# Patient Record
Sex: Female | Born: 1995 | Race: White | Hispanic: No | Marital: Single
Health system: Northeastern US, Academic
[De-identification: ages and names within clinical notes are randomized; demographics above are authoritative.]

---
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
## 2016-10-17 ENCOUNTER — Encounter: Payer: Self-pay | Admitting: Podiatry

## 2016-10-17 ENCOUNTER — Ambulatory Visit (INDEPENDENT_AMBULATORY_CARE_PROVIDER_SITE_OTHER): Payer: BLUE CROSS/BLUE SHIELD | Admitting: Podiatry

## 2016-10-17 VITALS — BP 117/83 | HR 75 | Temp 97.9°F | Resp 16 | Ht 63.0 in | Wt 105.0 lb

## 2016-10-17 DIAGNOSIS — L600 Ingrowing nail: Secondary | ICD-10-CM | POA: Diagnosis not present

## 2016-10-17 DIAGNOSIS — M79676 Pain in unspecified toe(s): Secondary | ICD-10-CM

## 2016-10-17 DIAGNOSIS — L03039 Cellulitis of unspecified toe: Secondary | ICD-10-CM | POA: Diagnosis not present

## 2016-10-17 MED ORDER — DOXYCYCLINE HYCLATE 100 MG PO TABS: 100.0000 mg | ORAL_TABLET | Freq: Two times a day (BID) | ORAL | 0 refills | Status: AC

## 2016-10-17 NOTE — Patient Instructions (Signed)

## 2016-10-17 NOTE — Progress Notes (Signed)
   Subjective:    Patient ID: Jillian Bell, female    DOB: 01/11/1996, 20 y.o.   MRN: [id number]  HPI    Review of Systems  All other systems reviewed and are negative.      Objective:   Physical Exam        Assessment & Plan:

## 2016-10-17 NOTE — Progress Notes (Signed)
Patient ID: Jillian Bell, female   DOB: 04/30/1996, 20 y.o.   MRN: [id number] Subjective: Patient presents today for evaluation of pain in her toe(s). Patient is concerned for possible ingrown nail. Patient states that the pain has been present for a few weeks now. Patient presents today for further treatment and evaluation.  Objective:  General: Well developed, nourished, in no acute distress, alert and oriented x3   Dermatology: Skin is warm, dry and supple bilateral. Lateral border of the left great toe appears to be erythematous with evidence of an ingrowing nail. Purulent drainage noted with intruding nail into the respective nail fold. Pain on palpation noted to the border of the nail fold. The remaining nails appear unremarkable at this time. There are no open sores, lesions.  Vascular: Dorsalis Pedis artery and Posterior Tibial artery pedal pulses palpable. No lower extremity edema noted.   Neruologic: Grossly intact via light touch bilateral.  Musculoskeletal: Muscular strength within normal limits in all groups bilateral. Normal range of motion noted to all pedal and ankle joints.   Assesement: #1 paronychia with ingrowing nail lateral border left great toe #2 cellulitis left great toe #3 pain in left great toe   Plan of Care:  1. Patient evaluated.  2. Discussed treatment alternatives and plan of care. Explained nail avulsion procedure and post procedure course to patient. 3. Patient opted for permanent partial nail avulsion.  4. Prior to procedure, local anesthesia infiltration utilized using 3 ml of a 50:50 mixture of 2% plain lidocaine and 0.5% plain marcaine in a normal hallux block fashion and a betadine prep performed.  5. Partial permanent nail avulsion with chemical matrixectomy performed using 3x30sec applications of phenol followed by alcohol flush.  6. Light dressing applied. 7. Prescription for doxycycline  8. Return to clinic in 2 weeks.   Patient is a Consulting civil engineerstudent at  [employer]  [name], DPM [facility]  Dr. [name], DPM   [address]                                        [place], [place] [address]                Office [phone]  Fax [phone]

## 2016-12-29 ENCOUNTER — Encounter: Payer: Self-pay | Admitting: *Deleted

## 2016-12-29 ENCOUNTER — Emergency Department
Admission: EM | Admit: 2016-12-29 | Discharge: 2016-12-29 | Disposition: A | Discharge status: Home / Self Care | Payer: BLUE CROSS/BLUE SHIELD | Attending: Emergency Medicine | Admitting: Emergency Medicine

## 2016-12-29 DIAGNOSIS — Y929 Unspecified place or not applicable: Secondary | ICD-10-CM | POA: Insufficient documentation

## 2016-12-29 DIAGNOSIS — S0181XA Laceration without foreign body of other part of head, initial encounter: Secondary | ICD-10-CM | POA: Diagnosis not present

## 2016-12-29 DIAGNOSIS — Y999 Unspecified external cause status: Secondary | ICD-10-CM | POA: Insufficient documentation

## 2016-12-29 DIAGNOSIS — Y939 Activity, unspecified: Secondary | ICD-10-CM | POA: Insufficient documentation

## 2016-12-29 DIAGNOSIS — S30811A Abrasion of abdominal wall, initial encounter: Secondary | ICD-10-CM | POA: Insufficient documentation

## 2016-12-29 DIAGNOSIS — W01198A Fall on same level from slipping, tripping and stumbling with subsequent striking against other object, initial encounter: Secondary | ICD-10-CM | POA: Diagnosis not present

## 2016-12-29 DIAGNOSIS — S0993XA Unspecified injury of face, initial encounter: Secondary | ICD-10-CM | POA: Diagnosis present

## 2016-12-29 MED ORDER — LIDOCAINE-EPINEPHRINE (PF) 2 %-1:200000 IJ SOLN
10.0000 mL | Freq: Once | INTRAMUSCULAR | Status: AC
  Administered 2016-12-29: 10 mL via INTRADERMAL
  Filled 2016-12-29: qty 10
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
## 2016-12-29 NOTE — ED Notes (Signed)
Lac cart at bedside  ?

## 2016-12-29 NOTE — ED Triage Notes (Signed)
Pt tripped and fell striking chin on cement tonight.  etoh use tonight.  No loc. No vomiting.   Bleeding controlled.  Pt alert.

## 2017-08-31 ENCOUNTER — Emergency Department
Admit: 2017-08-31 | Disposition: A | Discharge status: Home / Self Care | Source: Home / Self Care | Attending: Emergency Medicine | Admitting: Emergency Medicine

## 2017-08-31 ENCOUNTER — Ambulatory Visit: Admitting: Emergency Medicine

## 2017-08-31 LAB — HX TOXICOLOGY-DRUG,URINE
HX AMPHETAMINE: NOT DETECTED
HX BARBITUATES: NOT DETECTED
HX BENZODIAZEPINE: NOT DETECTED
HX BUPRENORPHINE, URINE: NOT DETECTED
HX CANNABINOIDS: NOT DETECTED
HX COCAINE: NOT DETECTED
HX FENTANYL, URINE: NOT DETECTED
HX METHADONE: NOT DETECTED
HX OPIATES: NOT DETECTED
HX OXYCODONE: NOT DETECTED
HX U ETHANOL: NOT DETECTED

## 2017-08-31 LAB — HX TOXICOLOGY-DRUG, SERUM
HX ACETAMINOPHEN: <3 ug/mL — ABNORMAL LOW (ref 10–20)
HX BARBITUATES QL, SERUM: NOT DETECTED
HX BENZODIAZEPINES QL, SERUM: NOT DETECTED
HX ETHANOL: <10 mg/dL
HX SALICYLATE: <5 mg/dL — ABNORMAL LOW (ref 15.0–29.9)
HX TCA: NOT DETECTED

## 2017-08-31 LAB — HX IMMUNOLOGY: HX BETA HCG QUANT: <5 m[IU]/mL (ref 0.0–5.0)
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
